# Patient Record
Sex: Female | Born: 1995 | Race: White | Hispanic: No | Marital: Single | Smoking: Never smoker
Health system: Southern US, Community
[De-identification: ages and names within clinical notes are randomized; demographics above are authoritative.]

## PROBLEM LIST (undated history)

## (undated) DIAGNOSIS — F32A Depression, unspecified: Secondary | ICD-10-CM

## (undated) DIAGNOSIS — T7840XA Allergy, unspecified, initial encounter: Secondary | ICD-10-CM

## (undated) DIAGNOSIS — F419 Anxiety disorder, unspecified: Secondary | ICD-10-CM

## (undated) HISTORY — DX: Allergy, unspecified, initial encounter: T78.40XA

## (undated) HISTORY — DX: Depression, unspecified: F32.A

---
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
## 2017-12-10 ENCOUNTER — Emergency Department
Admission: EM | Admit: 2017-12-10 | Discharge: 2017-12-10 | Disposition: A | Discharge status: Home / Self Care | Payer: Self-pay | Attending: Emergency Medicine | Admitting: Emergency Medicine

## 2017-12-10 ENCOUNTER — Other Ambulatory Visit: Payer: Self-pay

## 2017-12-10 DIAGNOSIS — R21 Rash and other nonspecific skin eruption: Secondary | ICD-10-CM | POA: Insufficient documentation

## 2017-12-10 MED ORDER — PENICILLIN G BENZATHINE 1200000 UNIT/2ML IM SUSP
2.4000 10*6.[IU] | Freq: Once | INTRAMUSCULAR | Status: AC
  Administered 2017-12-10: 2.4 10*6.[IU] via INTRAMUSCULAR
  Filled 2017-12-10 (×2): qty 4

## 2017-12-10 MED ORDER — DIPHENHYDRAMINE HCL 50 MG/ML IJ SOLN
50.0000 mg | Freq: Once | INTRAMUSCULAR | Status: AC
  Administered 2017-12-10: 50 mg via INTRAVENOUS
  Filled 2017-12-10: qty 1

## 2017-12-10 MED ORDER — METHYLPREDNISOLONE SODIUM SUCC 125 MG IJ SOLR
125.0000 mg | Freq: Once | INTRAMUSCULAR | Status: AC
  Administered 2017-12-10: 125 mg via INTRAVENOUS
  Filled 2017-12-10 (×3): qty 2

## 2017-12-10 MED ORDER — SODIUM CHLORIDE 0.9 % IV BOLUS
1000.0000 mL | Freq: Once | INTRAVENOUS | Status: AC
  Administered 2017-12-10: 1000 mL via INTRAVENOUS

## 2017-12-10 NOTE — ED Notes (Addendum)
Pt denies any pain. Pt states she noticed a rash Friday morning 9/13. Upon assessment this RN notices a rash on back, arms/legs  bilateral and abdomen. Pt states  taking OTC benadryl w/o relief. RR even and unlabored. NAD noted.

## 2017-12-10 NOTE — ED Triage Notes (Signed)
First Nurse Note:  C/O rash and throat feeling full since Friday.  Has been taking benadryl with no relief/  Benadryl last taken at around 1300 today.    AAOc3.  Skin warm and dry.  Flat red rash seen to trunk and neck.  Voice clear and strong.  Lungs CTA.  NAD

## 2017-12-10 NOTE — Discharge Instructions (Signed)
You have been seen in the emergency department for a rash.  As we discussed it is not entirely clear the cause of this rash at this time.  You have been treated with 2,400,000 units of penicillin as a precaution.  Please continue to use Benadryl 50 mg every 6 hours as needed for itching.  Return to the emergency department for any significant worsening of rash, development of fever, or any other symptom personally concerning to yourself.  Please follow-up with dermatology this week if the rash fails to improve.

## 2017-12-10 NOTE — ED Triage Notes (Signed)
Pt arrives to ED c/o of hives all over body and "throat feels like it's closing up." pt talking in complete sentences. States rash since Friday AM.   Started chlamydia drugs 2 weeks ago. Drank a beer with different ingredients in it today.   Alert, oriented, ambulatory.

## 2017-12-10 NOTE — ED Provider Notes (Signed)
[facility] Emergency Department Provider Note  Time seen: 5:30 PM  I have reviewed the triage vital signs and the nursing notes.   HISTORY  Chief Complaint Allergic Reaction    HPI Nancy Grant is a 22 y.o. female with no significant past medical history who presents to the emergency department for diffuse rash.  According to the patient she was recently treated for chlamydia after testing positive by her OB/GYN 2 weeks ago.  Took a one-time dose of antibiotics orally.  Also states at that time she was started on Depo-Provera injection.  States 2 days ago she developed diffuse hives all over her body.  States the rash started in her right upper extremity has spread to almost her whole body besides her face.  Right worse across the lower extremities were the lesions have coalesced to form large patches.  She describes them as somewhat itchy.  Denies any fever.  Denies any pain.  Denies any genital symptoms such as ulcerations recently.   History reviewed. No pertinent past medical history.  There are no active problems to display for this patient.   History reviewed. No pertinent surgical history.  Prior to Admission medications   Not on File    No Known Allergies  History reviewed. No pertinent family history.  Social History Social History   Tobacco Use  . Smoking status: Never Smoker  Substance Use Topics  . Alcohol use: Yes  . Drug use: Not on file    Review of Systems Constitutional: Negative for fever. Cardiovascular: Negative for chest pain. Respiratory: Negative for shortness of breath. Gastrointestinal: Negative for abdominal pain, vomiting Genitourinary: Negative for urinary compaints Musculoskeletal: Negative for musculoskeletal complaints Skin: Diffuse rash over most of the body.  Somewhat itchy. Neurological: Negative for headache All other ROS negative  ____________________________________________   PHYSICAL  EXAM:  VITAL SIGNS: ED Triage Vitals [12/10/17 1702]  Enc Vitals Group     BP 137/89     Pulse Rate 89     Resp 18     Temp 98.7 F (37.1 C)     Temp Source Oral     SpO2 100 %     Weight 140 lb (63.5 kg)     Height 5\' 9"  (1.753 m)     Head Circumference      Peak Flow      Pain Score 0     Pain Loc      Pain Edu?      Excl. in GC?    Constitutional: Alert and oriented. Well appearing and in no distress. Eyes: Normal exam ENT   Head: Normocephalic and atraumatic   Mouth/Throat: Mucous membranes are moist. Cardiovascular: Normal rate, regular rhythm. No murmur Respiratory: Normal respiratory effort without tachypnea nor retractions. Breath sounds are clear  Gastrointestinal: Soft and nontender. No distention.  Musculoskeletal: Nontender with normal range of motion in all extremities.  Neurologic:  Normal speech and language. No gross focal neurologic deficits Skin: Patient has diffuse rash over most of her body sparing her face.  Involves mostly the lower extremities but sparsely throughout the entire body more so on the back.  Does not involve the palms as well no lesions identified on the soles.  States it is somewhat itchy.  Across lower extremity many of the lesions have coalesced into larger patches.  Mildly erythematous. Psychiatric: Mood and affect are normal. Speech and behavior are normal.   ____________________________________________   INITIAL IMPRESSION / ASSESSMENT AND PLAN /  ED COURSE  Pertinent labs & imaging results that were available during my care of the patient were reviewed by me and considered in my medical decision making (see chart for details).  Patient presents to the emergency department with a diffuse rash.  Patient denies any known allergies.  Did take a Chlamydia treatment one-time dose 2 weeks ago as well as started on Depo-Provera 2 weeks ago.  Denies any new foods, denies any current medications, denies any new close detergents or other  new exposures known to the patient.  Took Benadryl at home and states the rash did not respond to the Benadryl.  As the patient did recently test positive for chlamydia she does have a diffuse maculopapular rash that does involve the palms I will send an RPR.  As a precaution we will treat with a one-time dose of IM penicillin 2,400,000 units.  We will place an IV and dose IV fluids Solu-Medrol and Benadryl to see if there is a response to histamine blockade.  Patient agreeable to plan of care.  Overall she appears very well, normal oropharynx, no signs of oral or pharyngeal edema, no wheeze no shortness of breath 100% room air saturation with a normal respiratory rate otherwise reassuring vitals.  Nearly no change in rash after 1 L of fluids IV Solu-Medrol and IV Benadryl.  Overall the patient appears very well, no distress.  She has been treated with 2,400,000 units of IM penicillin as a precaution.  RPR has been sent.  We will discharge with PCP follow-up.  Patient agreeable to plan of care.  ____________________________________________   FINAL CLINICAL IMPRESSION(S) / ED DIAGNOSES  Diffuse rash    Minna Antis, MD 12/10/17 1839

## 2017-12-12 ENCOUNTER — Other Ambulatory Visit: Payer: Self-pay

## 2017-12-12 ENCOUNTER — Encounter: Payer: Self-pay | Admitting: Intensive Care

## 2017-12-12 ENCOUNTER — Emergency Department
Admission: EM | Admit: 2017-12-12 | Discharge: 2017-12-12 | Disposition: A | Discharge status: Home / Self Care | Payer: Medicaid Other | Attending: Emergency Medicine | Admitting: Emergency Medicine

## 2017-12-12 DIAGNOSIS — R21 Rash and other nonspecific skin eruption: Secondary | ICD-10-CM | POA: Insufficient documentation

## 2017-12-12 HISTORY — DX: Anxiety disorder, unspecified: F41.9

## 2017-12-12 LAB — CBC WITH DIFFERENTIAL/PLATELET
BASOS ABS: 0 10*3/uL (ref 0–0.1)
BASOS PCT: 1 %
Eosinophils Absolute: 0.1 10*3/uL (ref 0–0.7)
Eosinophils Relative: 1 %
HEMATOCRIT: 41 % (ref 35.0–47.0)
Hemoglobin: 13.9 g/dL (ref 12.0–16.0)
LYMPHS PCT: 27 %
Lymphs Abs: 2 10*3/uL (ref 1.0–3.6)
MCH: 31.8 pg (ref 26.0–34.0)
MCHC: 34 g/dL (ref 32.0–36.0)
MCV: 93.6 fL (ref 80.0–100.0)
Monocytes Absolute: 0.6 10*3/uL (ref 0.2–0.9)
Monocytes Relative: 8 %
NEUTROS ABS: 4.8 10*3/uL (ref 1.4–6.5)
NEUTROS PCT: 63 %
Platelets: 216 10*3/uL (ref 150–440)
RBC: 4.38 MIL/uL (ref 3.80–5.20)
RDW: 12.5 % (ref 11.5–14.5)
WBC: 7.4 10*3/uL (ref 3.6–11.0)

## 2017-12-12 LAB — COMPREHENSIVE METABOLIC PANEL
ALBUMIN: 4 g/dL (ref 3.5–5.0)
ALT: 13 U/L (ref 0–44)
AST: 16 U/L (ref 15–41)
Alkaline Phosphatase: 50 U/L (ref 38–126)
Anion gap: 8 (ref 5–15)
BILIRUBIN TOTAL: 0.8 mg/dL (ref 0.3–1.2)
BUN: 12 mg/dL (ref 6–20)
CHLORIDE: 107 mmol/L (ref 98–111)
CO2: 24 mmol/L (ref 22–32)
CREATININE: 0.77 mg/dL (ref 0.44–1.00)
Calcium: 9 mg/dL (ref 8.9–10.3)
GFR calc Af Amer: >60 mL/min (ref >60)
GFR calc non Af Amer: >60 mL/min (ref >60)
GLUCOSE: 91 mg/dL (ref 70–99)
POTASSIUM: 3.9 mmol/L (ref 3.5–5.1)
Sodium: 139 mmol/L (ref 135–145)
Total Protein: 7.5 g/dL (ref 6.5–8.1)

## 2017-12-12 LAB — RPR: RPR: NONREACTIVE

## 2017-12-12 MED ORDER — HYDROXYZINE HCL 25 MG PO TABS: 25.0000 mg | ORAL_TABLET | Freq: Four times a day (QID) | ORAL | 0 refills | Status: DC | PRN

## 2017-12-12 MED ORDER — DIPHENHYDRAMINE HCL 50 MG/ML IJ SOLN
25.0000 mg | Freq: Once | INTRAMUSCULAR | Status: AC
  Administered 2017-12-12: 25 mg via INTRAVENOUS
  Filled 2017-12-12: qty 1

## 2017-12-12 MED ORDER — DEXAMETHASONE SODIUM PHOSPHATE 10 MG/ML IJ SOLN
10.0000 mg | Freq: Once | INTRAMUSCULAR | Status: AC
  Administered 2017-12-12: 10 mg via INTRAVENOUS

## 2017-12-12 MED ORDER — FAMOTIDINE IN NACL 20-0.9 MG/50ML-% IV SOLN
20.0000 mg | Freq: Once | INTRAVENOUS | Status: AC
  Administered 2017-12-12: 20 mg via INTRAVENOUS
  Filled 2017-12-12: qty 50

## 2017-12-12 MED ORDER — RANITIDINE HCL 150 MG PO TABS: 150.0000 mg | ORAL_TABLET | Freq: Two times a day (BID) | ORAL | 1 refills | Status: DC

## 2017-12-12 MED ORDER — DEXAMETHASONE SODIUM PHOSPHATE 10 MG/ML IJ SOLN
10.0000 mg | Freq: Once | INTRAMUSCULAR | Status: DC
  Filled 2017-12-12: qty 1

## 2017-12-12 MED ORDER — PREDNISONE 10 MG PO TABS: ORAL_TABLET | ORAL | 0 refills | Status: DC

## 2017-12-12 NOTE — ED Provider Notes (Signed)
[facility] Emergency Department Provider Note   ____________________________________________   First MD Initiated Contact with Patient 12/12/17 1118     (approximate)  I have reviewed the triage vital signs and the nursing notes.   HISTORY  Chief Complaint Rash   HPI Nancy Grant is a 22 y.o. female presents to the ED with complaint of rash.  Patient was seen in the ED on 12/12/2017 for the same rash.  She states that at that time she was given steroids and was improving until yesterday.  She still is unsure as to what caused this rash.  And looking through her records it appears that she was treated for chlamydia.  Patient states that rash is still itchy.  She is still denies any fever, chills, nausea, or difficulty breathing.  She denies any pain.  Records from her previous visit shows an RPR was also nonreactive.   Past Medical History:  Diagnosis Date  . Anxiety     There are no active problems to display for this patient.   History reviewed. No pertinent surgical history.  Prior to Admission medications   Medication Sig Start Date End Date Taking? Authorizing Provider  hydrOXYzine (ATARAX/VISTARIL) 25 MG tablet Take 1 tablet (25 mg total) by mouth every 6 (six) hours as needed for itching. 12/12/17   Tommi Rumps, PA-C  predniSONE (DELTASONE) 10 MG tablet Take 6 tablets  today, on day 2 take 5 tablets, day 3 take 4 tablets, day 4 take 3 tablets, day 5 take  2 tablets and 1 tablet the last day 12/12/17   Tommi Rumps, PA-C  ranitidine (ZANTAC) 150 MG tablet Take 1 tablet (150 mg total) by mouth 2 (two) times daily. 12/12/17 12/12/18  Tommi Rumps, PA-C    Allergies Patient has no known allergies.  History reviewed. No pertinent family history.  Social History Social History   Tobacco Use  . Smoking status: Never Smoker  . Smokeless tobacco: Never Used  Substance Use Topics  . Alcohol use: Yes    Comment: occ  . Drug  use: Never    Review of Systems Constitutional: No fever/chills ENT: No sore throat. Cardiovascular: Denies chest pain. Respiratory: Denies shortness of breath. Gastrointestinal:   No nausea, no vomiting.  Musculoskeletal: Negative for muscle aches. Skin: Positive for skin rash. Neurological: Negative for headaches, focal weakness or numbness. ___________________________________________   PHYSICAL EXAM:  VITAL SIGNS: ED Triage Vitals  Enc Vitals Group     BP 12/12/17 1251 109/73     Pulse Rate 12/12/17 1251 90     Resp 12/12/17 1251 16     Temp --      Temp src --      SpO2 12/12/17 1251 100 %     Weight --      Height --      Head Circumference --      Peak Flow --      Pain Score 12/12/17 1122 0     Pain Loc --      Pain Edu? --      Excl. in GC? --    Constitutional: Alert and oriented. Well appearing and in no acute distress. Eyes: Conjunctivae are normal.  Head: Atraumatic. Nose: No congestion/rhinnorhea. Mouth/Throat: Mucous membranes are moist.  Oropharynx non-erythematous.  No edema uvula is midline. Neck: No stridor.   Hematological/Lymphatic/Immunilogical: No cervical lymphadenopathy. Cardiovascular: Normal rate, regular rhythm. Grossly normal heart sounds.  Good peripheral circulation. Respiratory: Normal respiratory  effort.  No retractions. Lungs CTAB. Musculoskeletal: No lower extremity tenderness nor edema.  No joint effusions. Neurologic:  Normal speech and language. No gross focal neurologic deficits are appreciated.  Skin:  Skin is warm, dry and intact.  Diffuse erythematous rash scattered over the trunk and upper and lower extremities.  No rashes noted on the face.  There are single erythematous papules along with macular areas.  No vesicles are noted and no drainage from these areas.  Areas are nontender to palpation.  No warmth is appreciated. Psychiatric: Mood and affect are normal. Speech and behavior are  normal.  ____________________________________________   LABS (all labs ordered are listed, but only abnormal results are displayed)  Labs Reviewed  CBC WITH DIFFERENTIAL/PLATELET  COMPREHENSIVE METABOLIC PANEL    PROCEDURES  Procedure(s) performed: None  Procedures  Critical Care performed: No  ____________________________________________   INITIAL IMPRESSION / ASSESSMENT AND PLAN / ED COURSE  As part of my medical decision making, I reviewed the following data within the electronic MEDICAL RECORD NUMBER Notes from prior ED visits and [place] Controlled Substance Database  Patient presents to the ED with rash that reoccurred after being seen on 12/02/2017.  Patient stated that with the steroids that she received that day she was improving.  Patient was given Decadron 10 mg IV, Benadryl 25 mg IV and Pepcid 20 mg IV.  Patient improved and itching was reduced.  Patient was given a prescription for continued prednisone, Atarax as needed and Zantac 150 mg 1 twice daily for 30 days.  She is to follow-up with 1 of the dermatologist listed on her discharge papers.  Patient was improved prior to discharge.  ____________________________________________   FINAL CLINICAL IMPRESSION(S) / ED DIAGNOSES  Final diagnoses:  Rash and nonspecific skin eruption     ED Discharge Orders         Ordered    predniSONE (DELTASONE) 10 MG tablet     12/12/17 1235    ranitidine (ZANTAC) 150 MG tablet  2 times daily     12/12/17 1235    hydrOXYzine (ATARAX/VISTARIL) 25 MG tablet  Every 6 hours PRN     12/12/17 1235           Note:  This document was prepared using Dragon voice recognition software and may include unintentional dictation errors.    Tommi RumpsSummers, Rhonda L, PA-C 12/12/17 1615    Governor RooksLord, Rebecca, MD 12/14/17 [phone]

## 2017-12-12 NOTE — ED Triage Notes (Signed)
Patient was seen here Sunday night for rash all over body and was told to come back if rash got worse. Rash has continually gotten worse and now lips are swelling and throat feels scratchy. Denies problems eating/ swallowing.

## 2017-12-12 NOTE — Discharge Instructions (Addendum)
Follow-up with one the dermatologist listed on your discharge papers.  [facility] has several doctors in this building.  Dr. Adolphus Birchwoodasher also has someone working with him.  Called today as it may be next month before they can actually see you.  Begin taking prednisone as directed tapering down.  Try to take this medication same time every day.  Also Atarax if needed for itching.  Zantac is also an antihistamine and should be taken twice a day which will help with your rash.

## 2018-01-06 ENCOUNTER — Emergency Department
Admission: EM | Admit: 2018-01-06 | Discharge: 2018-01-06 | Disposition: A | Discharge status: Home / Self Care | Payer: No Typology Code available for payment source | Attending: Emergency Medicine | Admitting: Emergency Medicine

## 2018-01-06 ENCOUNTER — Emergency Department: Payer: No Typology Code available for payment source

## 2018-01-06 ENCOUNTER — Other Ambulatory Visit: Payer: Self-pay

## 2018-01-06 DIAGNOSIS — Y9389 Activity, other specified: Secondary | ICD-10-CM | POA: Diagnosis not present

## 2018-01-06 DIAGNOSIS — F419 Anxiety disorder, unspecified: Secondary | ICD-10-CM | POA: Insufficient documentation

## 2018-01-06 DIAGNOSIS — Y998 Other external cause status: Secondary | ICD-10-CM | POA: Diagnosis not present

## 2018-01-06 DIAGNOSIS — S80211A Abrasion, right knee, initial encounter: Secondary | ICD-10-CM | POA: Diagnosis not present

## 2018-01-06 DIAGNOSIS — Z79899 Other long term (current) drug therapy: Secondary | ICD-10-CM | POA: Diagnosis not present

## 2018-01-06 DIAGNOSIS — T07XXXA Unspecified multiple injuries, initial encounter: Secondary | ICD-10-CM

## 2018-01-06 DIAGNOSIS — S0990XA Unspecified injury of head, initial encounter: Secondary | ICD-10-CM | POA: Diagnosis not present

## 2018-01-06 DIAGNOSIS — S60511A Abrasion of right hand, initial encounter: Secondary | ICD-10-CM | POA: Diagnosis not present

## 2018-01-06 DIAGNOSIS — Y92410 Unspecified street and highway as the place of occurrence of the external cause: Secondary | ICD-10-CM | POA: Diagnosis not present

## 2018-01-06 DIAGNOSIS — S6991XA Unspecified injury of right wrist, hand and finger(s), initial encounter: Secondary | ICD-10-CM | POA: Diagnosis present

## 2018-01-06 LAB — CBC WITH DIFFERENTIAL/PLATELET
Abs Immature Granulocytes: 0.02 10*3/uL (ref 0.00–0.07)
BASOS PCT: 1 %
Basophils Absolute: 0 10*3/uL (ref 0.0–0.1)
EOS ABS: 0.1 10*3/uL (ref 0.0–0.5)
Eosinophils Relative: 2 %
HCT: 38.7 % (ref 36.0–46.0)
Hemoglobin: 13.5 g/dL (ref 12.0–15.0)
IMMATURE GRANULOCYTES: 0 %
LYMPHS ABS: 2.7 10*3/uL (ref 0.7–4.0)
Lymphocytes Relative: 43 %
MCH: 31.8 pg (ref 26.0–34.0)
MCHC: 34.9 g/dL (ref 30.0–36.0)
MCV: 91.1 fL (ref 80.0–100.0)
MONO ABS: 0.5 10*3/uL (ref 0.1–1.0)
MONOS PCT: 8 %
Neutro Abs: 2.9 10*3/uL (ref 1.7–7.7)
Neutrophils Relative %: 46 %
PLATELETS: 248 10*3/uL (ref 150–400)
RBC: 4.25 MIL/uL (ref 3.87–5.11)
RDW: 12.2 % (ref 11.5–15.5)
WBC: 6.3 10*3/uL (ref 4.0–10.5)
nRBC: 0 % (ref 0.0–0.2)

## 2018-01-06 LAB — COMPREHENSIVE METABOLIC PANEL
ALT: 17 U/L (ref 0–44)
AST: 21 U/L (ref 15–41)
Albumin: 4.3 g/dL (ref 3.5–5.0)
Alkaline Phosphatase: 57 U/L (ref 38–126)
Anion gap: 6 (ref 5–15)
BUN: 6 mg/dL (ref 6–20)
CHLORIDE: 112 mmol/L — AB (ref 98–111)
CO2: 24 mmol/L (ref 22–32)
Calcium: 8.5 mg/dL — ABNORMAL LOW (ref 8.9–10.3)
Creatinine, Ser: 0.61 mg/dL (ref 0.44–1.00)
GFR calc Af Amer: >60 mL/min (ref >60)
Glucose, Bld: 91 mg/dL (ref 70–99)
POTASSIUM: 3.3 mmol/L — AB (ref 3.5–5.1)
SODIUM: 142 mmol/L (ref 135–145)
Total Bilirubin: 0.4 mg/dL (ref 0.3–1.2)
Total Protein: 7.7 g/dL (ref 6.5–8.1)

## 2018-01-06 LAB — ETHANOL: Alcohol, Ethyl (B): 166 mg/dL — ABNORMAL HIGH (ref <10)

## 2018-01-06 MED ORDER — ONDANSETRON 4 MG PO TBDP: 4.0000 mg | ORAL_TABLET | Freq: Three times a day (TID) | ORAL | 0 refills | Status: DC | PRN

## 2018-01-06 MED ORDER — HYDROCODONE-ACETAMINOPHEN 5-325 MG PO TABS: 1.0000 | ORAL_TABLET | Freq: Four times a day (QID) | ORAL | 0 refills | Status: DC | PRN

## 2018-01-06 MED ORDER — SODIUM CHLORIDE 0.9 % IV BOLUS
1000.0000 mL | Freq: Once | INTRAVENOUS | Status: AC
  Administered 2018-01-06: 1000 mL via INTRAVENOUS

## 2018-01-06 NOTE — Discharge Instructions (Addendum)
1.  You may take medicines as needed for pain and nausea (Norco/Zofran #15). 2.  Return to the ER for worsening symptoms, vomiting, lethargy, difficulty breathing or other concerns.

## 2018-01-06 NOTE — ED Notes (Signed)
AAOx3.  Skin warm and dry.  NAD 
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
## 2021-09-06 ENCOUNTER — Ambulatory Visit: Payer: Medicaid Other | Admitting: Family Medicine

## 2022-08-11 ENCOUNTER — Ambulatory Visit (INDEPENDENT_AMBULATORY_CARE_PROVIDER_SITE_OTHER): Payer: No Payment, Other | Admitting: Clinical

## 2022-08-11 ENCOUNTER — Encounter (HOSPITAL_COMMUNITY): Payer: Self-pay

## 2022-08-11 DIAGNOSIS — F331 Major depressive disorder, recurrent, moderate: Secondary | ICD-10-CM | POA: Diagnosis not present
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
## 2023-04-21 ENCOUNTER — Ambulatory Visit (INDEPENDENT_AMBULATORY_CARE_PROVIDER_SITE_OTHER): Payer: BC Managed Care – PPO | Admitting: Nurse Practitioner

## 2023-04-21 ENCOUNTER — Encounter: Payer: Self-pay | Admitting: Nurse Practitioner

## 2023-04-21 VITALS — BP 110/68 | HR 89 | Temp 98.2°F | Ht 68.0 in | Wt 183.8 lb

## 2023-04-21 DIAGNOSIS — F331 Major depressive disorder, recurrent, moderate: Secondary | ICD-10-CM | POA: Insufficient documentation

## 2023-04-21 DIAGNOSIS — K625 Hemorrhage of anus and rectum: Secondary | ICD-10-CM | POA: Insufficient documentation

## 2023-04-21 DIAGNOSIS — K219 Gastro-esophageal reflux disease without esophagitis: Secondary | ICD-10-CM | POA: Diagnosis not present

## 2023-04-21 DIAGNOSIS — F411 Generalized anxiety disorder: Secondary | ICD-10-CM | POA: Diagnosis not present

## 2023-04-21 DIAGNOSIS — R195 Other fecal abnormalities: Secondary | ICD-10-CM | POA: Insufficient documentation

## 2023-04-21 MED ORDER — BUSPIRONE HCL 7.5 MG PO TABS: 7.5000 mg | ORAL_TABLET | Freq: Two times a day (BID) | ORAL | 2 refills | Status: DC

## 2023-04-21 NOTE — Progress Notes (Signed)
Bethanie Dicker, NP-C Phone: [phone]  Nancy Grant is a 28 y.o. female who presents today to establish care.   Discussed the use of AI scribe software for clinical note transcription with the patient, who gave verbal consent to proceed.  History of Present Illness   The patient, with a history of anxiety and depression, presents with concerns about recent changes in bowel habits and chronic intermittent rectal bleeding. They report a recent episode of gastroenteritis, which has since resolved, but has been followed by persistent loose stools for the past week. The patient also notes a chronic issue of intermittent rectal bleeding over the past nine months, which has become more frequent recently. The bleeding is described as visible in the stool and on toilet paper, but not associated with significant straining or pain.  The patient also reports chronic, intermittent abdominal discomfort described as a "gas bubble," which is relieved by passing gas. They deny any associated nausea or vomiting. The patient's diet is reported as balanced, with a preference for vegetables and chicken, and moderate intake of spicy foods and caffeine.  Regarding their mental health, the patient reports a history of anxiety and depression, managed intermittently with Lexapro, Wellbutrin, and Vistaril in the past. They describe their anxiety as situational, with periods of exacerbation and remission. The patient expresses interest in starting a daily medication for anxiety management, as they have noticed a pattern of their anxiety worsening to the point of inducing depressive symptoms. They have previously tried Lexapro and Wellbutrin, but discontinued due to undesirable side effects.      Social History   Tobacco Use  Smoking Status Never  Smokeless Tobacco Never    No current outpatient medications on file prior to visit.   No current facility-administered medications on file prior to visit.    ROS see  history of present illness  Objective  Physical Exam Vitals:   04/21/23 1400  BP: 110/68  Pulse: 89  Temp: 98.2 F (36.8 C)  SpO2: 95%    BP Readings from Last 3 Encounters:  04/21/23 110/68  01/06/18 120/82  12/12/17 109/73   Wt Readings from Last 3 Encounters:  04/21/23 183 lb 12.8 oz (83.4 kg)  01/06/18 145 lb (65.8 kg)  12/10/17 140 lb (63.5 kg)    Physical Exam Constitutional:      General: She is not in acute distress.    Appearance: Normal appearance.  HENT:     Head: Normocephalic.  Cardiovascular:     Rate and Rhythm: Normal rate and regular rhythm.     Heart sounds: Normal heart sounds.  Pulmonary:     Effort: Pulmonary effort is normal.     Breath sounds: Normal breath sounds.  Abdominal:     General: Abdomen is flat. Bowel sounds are normal.     Palpations: Abdomen is soft.     Tenderness: There is no abdominal tenderness.  Skin:    General: Skin is warm and dry.  Neurological:     General: No focal deficit present.     Mental Status: She is alert.  Psychiatric:        Mood and Affect: Mood normal.        Behavior: Behavior normal.    Assessment/Plan: Please see individual problem list.  Generalized anxiety disorder Assessment & Plan: There is a history of anxiety and depression with intermittent treatment. Anxiety is situational and can lead to depressive episodes. Previous trials of Lexapro and Wellbutrin were not well tolerated. PHQ- 2 and  GAD- 10 today. Start Buspar 7.5mg  twice daily for anxiety management. Plan for follow-up in two months to assess mood and effectiveness of Buspar. Counseled patient on common side effects. Encouraged to contact if worsening symptoms, unusual behavior changes or suicidal thoughts occur.   Orders: -     busPIRone HCl; Take 1 tablet (7.5 mg total) by mouth 2 (two) times daily.  Dispense: 60 tablet; Refill: 2  Rectal bleeding Assessment & Plan: Chronic intermittent rectal bleeding over the past year. There is  a family history of colitis. No significant abdominal pain, nausea, or vomiting is reported. Refer to Gastroenterology for further evaluation of chronic rectal bleeding and loose stools.  Orders: -     Ambulatory referral to Gastroenterology  Loose stools Assessment & Plan: There is a recent history of gastroenteritis with ongoing loose stools. There is a family history of colitis. No significant abdominal pain, nausea, or vomiting is reported. Refer to Gastroenterology for further evaluation of chronic rectal bleeding and loose stools. Advise following a low FODMAP diet.   Orders: -     Ambulatory referral to Gastroenterology  Gastroesophageal reflux disease, unspecified whether esophagitis present Assessment & Plan: Start over-the-counter famotidine daily for potential acid-related symptoms. Advise avoiding foods that may exacerbate symptoms, such as spicy, greasy, fatty, acidic foods, caffeine, and alcohol. Refer to GI.   Orders: -     Ambulatory referral to Gastroenterology    Return in about 2 months (around 06/19/2023) for Annual Exam.   Bethanie Dicker, NP-C [facility] - [facility]

## 2023-04-21 NOTE — Assessment & Plan Note (Signed)
There is a history of anxiety and depression with intermittent treatment. Anxiety is situational and can lead to depressive episodes. Previous trials of Lexapro and Wellbutrin were not well tolerated. PHQ- 2 and GAD- 10 today. Start Buspar 7.5mg  twice daily for anxiety management. Plan for follow-up in two months to assess mood and effectiveness of Buspar. Counseled patient on common side effects. Encouraged to contact if worsening symptoms, unusual behavior changes or suicidal thoughts occur.

## 2023-04-21 NOTE — Assessment & Plan Note (Addendum)
Chronic intermittent rectal bleeding over the past year. There is a family history of colitis. No significant abdominal pain, nausea, or vomiting is reported. Refer to Gastroenterology for further evaluation of chronic rectal bleeding and loose stools.

## 2023-04-21 NOTE — Assessment & Plan Note (Addendum)
There is a recent history of gastroenteritis with ongoing loose stools. There is a family history of colitis. No significant abdominal pain, nausea, or vomiting is reported. Refer to Gastroenterology for further evaluation of chronic rectal bleeding and loose stools. Advise following a low FODMAP diet.

## 2023-04-21 NOTE — Assessment & Plan Note (Addendum)
Start over-the-counter famotidine daily for potential acid-related symptoms. Advise avoiding foods that may exacerbate symptoms, such as spicy, greasy, fatty, acidic foods, caffeine, and alcohol. Refer to GI.

## 2023-05-03 ENCOUNTER — Other Ambulatory Visit: Payer: Self-pay | Admitting: Medical Genetics

## 2023-05-04 DIAGNOSIS — Z20822 Contact with and (suspected) exposure to covid-19: Secondary | ICD-10-CM | POA: Diagnosis not present

## 2023-05-04 DIAGNOSIS — J09X2 Influenza due to identified novel influenza A virus with other respiratory manifestations: Secondary | ICD-10-CM | POA: Diagnosis not present

## 2023-05-04 DIAGNOSIS — Z3202 Encounter for pregnancy test, result negative: Secondary | ICD-10-CM | POA: Diagnosis not present

## 2023-06-30 ENCOUNTER — Ambulatory Visit (INDEPENDENT_AMBULATORY_CARE_PROVIDER_SITE_OTHER): Payer: BC Managed Care – PPO | Admitting: Nurse Practitioner

## 2023-06-30 ENCOUNTER — Encounter: Payer: Self-pay | Admitting: Nurse Practitioner

## 2023-06-30 ENCOUNTER — Other Ambulatory Visit

## 2023-06-30 VITALS — BP 110/70 | HR 75 | Temp 98.2°F | Ht 68.0 in | Wt 178.6 lb

## 2023-06-30 DIAGNOSIS — Z13220 Encounter for screening for lipoid disorders: Secondary | ICD-10-CM

## 2023-06-30 DIAGNOSIS — F411 Generalized anxiety disorder: Secondary | ICD-10-CM

## 2023-06-30 DIAGNOSIS — Z23 Encounter for immunization: Secondary | ICD-10-CM | POA: Diagnosis not present

## 2023-06-30 DIAGNOSIS — Z1159 Encounter for screening for other viral diseases: Secondary | ICD-10-CM

## 2023-06-30 DIAGNOSIS — E663 Overweight: Secondary | ICD-10-CM | POA: Diagnosis not present

## 2023-06-30 DIAGNOSIS — Z0000 Encounter for general adult medical examination without abnormal findings: Secondary | ICD-10-CM | POA: Insufficient documentation

## 2023-06-30 DIAGNOSIS — Z803 Family history of malignant neoplasm of breast: Secondary | ICD-10-CM

## 2023-06-30 DIAGNOSIS — Z1329 Encounter for screening for other suspected endocrine disorder: Secondary | ICD-10-CM | POA: Diagnosis not present

## 2023-06-30 DIAGNOSIS — Z114 Encounter for screening for human immunodeficiency virus [HIV]: Secondary | ICD-10-CM

## 2023-06-30 LAB — LIPID PANEL
Cholesterol: 176 mg/dL (ref 0–200)
HDL: 38 mg/dL — ABNORMAL LOW (ref >39.00)
LDL Cholesterol: 121 mg/dL — ABNORMAL HIGH (ref 0–99)
NonHDL: 138.22
Total CHOL/HDL Ratio: 5
Triglycerides: 88 mg/dL (ref 0.0–149.0)
VLDL: 17.6 mg/dL (ref 0.0–40.0)

## 2023-06-30 LAB — COMPREHENSIVE METABOLIC PANEL WITH GFR
ALT: 23 U/L (ref 0–35)
AST: 19 U/L (ref 0–37)
Albumin: 4.6 g/dL (ref 3.5–5.2)
Alkaline Phosphatase: 54 U/L (ref 39–117)
BUN: 8 mg/dL (ref 6–23)
CO2: 26 meq/L (ref 19–32)
Calcium: 9.6 mg/dL (ref 8.4–10.5)
Chloride: 103 meq/L (ref 96–112)
Creatinine, Ser: 0.76 mg/dL (ref 0.40–1.20)
GFR: 107.22 mL/min (ref >60.00)
Glucose, Bld: 96 mg/dL (ref 70–99)
Potassium: 4.2 meq/L (ref 3.5–5.1)
Sodium: 139 meq/L (ref 135–145)
Total Bilirubin: 0.5 mg/dL (ref 0.2–1.2)
Total Protein: 8.1 g/dL (ref 6.0–8.3)

## 2023-06-30 LAB — CBC WITH DIFFERENTIAL/PLATELET
Basophils Absolute: 0.1 10*3/uL (ref 0.0–0.1)
Basophils Relative: 1 % (ref 0.0–3.0)
Eosinophils Absolute: 0.6 10*3/uL (ref 0.0–0.7)
Eosinophils Relative: 8.4 % — ABNORMAL HIGH (ref 0.0–5.0)
HCT: 41.7 % (ref 36.0–46.0)
Hemoglobin: 14 g/dL (ref 12.0–15.0)
Lymphocytes Relative: 34.5 % (ref 12.0–46.0)
Lymphs Abs: 2.4 10*3/uL (ref 0.7–4.0)
MCHC: 33.6 g/dL (ref 30.0–36.0)
MCV: 91.9 fl (ref 78.0–100.0)
Monocytes Absolute: 0.4 10*3/uL (ref 0.1–1.0)
Monocytes Relative: 5.7 % (ref 3.0–12.0)
Neutro Abs: 3.5 10*3/uL (ref 1.4–7.7)
Neutrophils Relative %: 50.4 % (ref 43.0–77.0)
Platelets: 239 10*3/uL (ref 150.0–400.0)
RBC: 4.54 Mil/uL (ref 3.87–5.11)
RDW: 13.2 % (ref 11.5–15.5)
WBC: 7 10*3/uL (ref 4.0–10.5)

## 2023-06-30 LAB — VITAMIN D 25 HYDROXY (VIT D DEFICIENCY, FRACTURES): VITD: 13.37 ng/mL — ABNORMAL LOW (ref 30.00–100.00)

## 2023-06-30 LAB — TSH: TSH: 1.67 u[IU]/mL (ref 0.35–5.50)

## 2023-06-30 LAB — HEMOGLOBIN A1C: Hgb A1c MFr Bld: 5.3 % (ref 4.6–6.5)

## 2023-06-30 NOTE — Progress Notes (Signed)
 Bethanie Dicker, NP-C Phone: [phone]  Dyana Magner Croak is a 28 y.o. female who presents today for annual exam.   Discussed the use of AI scribe software for clinical note transcription with the patient, who gave verbal consent to proceed.  History of Present Illness   Xian Apostol Poitra is a 28 year old female who presents for a physical exam and follow-up on Buspar use.  She takes Buspar twice a day for anxiety, which she describes as manageable and not affecting her daily life. She occasionally experiences significant fatigue after taking Buspar, though this has not been a recent issue. No chest pain, shortness of breath, abdominal pain, urinary symptoms, abnormal discharge, headaches, dizziness, trouble swallowing, skin changes, joint pains, or swelling in her legs. Her mood and anxiety levels are stable, but she sometimes struggles with sleep, particularly staying asleep, and manages this with melatonin.  She has not received feedback from a GI referral made a couple of weeks ago. She is due for a pap smear, as her last one was in September 2019, and plans to return for this after her menstrual period, which she describes as light and regular, occurring every 30 days. She experiences increased stress levels a week before her period, which normalizes afterward. She is not on birth control.  She has had two COVID vaccines and is unsure about her last tetanus shot, but records indicate it was in 2008. She believes she was up to date as of 2017. She has not had recent blood work and is considering it. She tested positive for an unspecified STD in the past, treated with a single-dose medication that caused a severe allergic reaction, leading to emergency room visits.  Her family history is significant for breast cancer; her mother was diagnosed at age 24 and her great-grandmother also had breast cancer. Her mother's cancer was caught early and treated surgically, and she is on hormone blockers due  to estrogen-related cancer. There is also a mention of a great-aunt who died of 'female cancer.'  She leads a sedentary lifestyle but tries to eat healthily, focusing on vegetables and moderate intake of carbs and meats. She does not smoke, drink alcohol, or use drugs. She is not sexually active and does not currently see a dentist or eye doctor.      Social History   Tobacco Use  Smoking Status Never  Smokeless Tobacco Never    Current Outpatient Medications on File Prior to Visit  Medication Sig Dispense Refill   busPIRone (BUSPAR) 7.5 MG tablet Take 1 tablet (7.5 mg total) by mouth 2 (two) times daily. 60 tablet 2   No current facility-administered medications on file prior to visit.     ROS see history of present illness  Objective  Physical Exam Vitals:   06/30/23 1307  BP: 110/70  Pulse: 75  Temp: 98.2 F (36.8 C)  SpO2: 97%    BP Readings from Last 3 Encounters:  06/30/23 110/70  04/21/23 110/68  01/06/18 120/82   Wt Readings from Last 3 Encounters:  06/30/23 178 lb 9.6 oz (81 kg)  04/21/23 183 lb 12.8 oz (83.4 kg)  01/06/18 145 lb (65.8 kg)    Physical Exam Constitutional:      General: She is not in acute distress.    Appearance: Normal appearance.  HENT:     Head: Normocephalic.     Right Ear: Tympanic membrane normal.     Left Ear: Tympanic membrane normal.     Nose: Nose normal.  Mouth/Throat:     Mouth: Mucous membranes are moist.     Pharynx: Oropharynx is clear.  Eyes:     Conjunctiva/sclera: Conjunctivae normal.     Pupils: Pupils are equal, round, and reactive to light.  Neck:     Thyroid: No thyromegaly.  Cardiovascular:     Rate and Rhythm: Normal rate and regular rhythm.     Heart sounds: Normal heart sounds.  Pulmonary:     Effort: Pulmonary effort is normal.     Breath sounds: Normal breath sounds.  Abdominal:     General: Abdomen is flat. Bowel sounds are normal.     Palpations: Abdomen is soft. There is no mass.      Tenderness: There is no abdominal tenderness.  Musculoskeletal:        General: Normal range of motion.  Lymphadenopathy:     Cervical: No cervical adenopathy.  Skin:    General: Skin is warm and dry.     Findings: No rash.  Neurological:     General: No focal deficit present.     Mental Status: She is alert.  Psychiatric:        Mood and Affect: Mood normal.        Behavior: Behavior normal.     Assessment/Plan: Please see individual problem list.  Preventative health care Assessment & Plan: Physical exam complete. We will check lab work as outlined and contact patient with results. A Pap smear is due since the last one was in September 2019. She prefers to return for the procedure. Schedule a Pap smear appointment. The last tetanus shot was in 2008, and a booster is due. No recent injuries reported. Administer a tetanus booster shot today. Declines flu and additional COVID vaccines. She is generally healthy but leads a sedentary lifestyle. She maintains a balanced diet and does not smoke, drink alcohol, or use drugs. Encourage increased physical activity. Advise on finding a dentist and eye doctor. Return for pap, then in one year, sooner as needed.   Orders: -     CBC with Differential/Platelet -     Comprehensive metabolic panel with GFR  Generalized anxiety disorder Assessment & Plan: Anxiety is well-managed with Buspar 7.5 mg twice daily. Continue the current dosage of Buspar. PHQ-4 and GAD- 5 today. Monitor anxiety symptoms and adjust medication if necessary.  Orders: -     VITAMIN D 25 Hydroxy (Vit-D Deficiency, Fractures)  Family history of breast cancer in mother Assessment & Plan: There is a significant family history with her mother and great-grandmother diagnosed. Recommend starting mammogram screenings at age 21. Educate on self-breast exams and advise reporting any abnormalities.   Overweight (BMI 25.0-29.9) -     Hemoglobin A1c  Thyroid disorder screen -      TSH  Lipid screening -     Lipid panel  Encounter for screening for HIV -     HIV Antibody (routine testing w rflx)  Encounter for hepatitis C screening test for low risk patient -     Hepatitis C antibody  Need for Tdap vaccination -     Tdap vaccine greater than or equal to 7yo IM    Return for Pap smear, then in one year, sooner as needed.   Bethanie Dicker, NP-C [facility] - [facility]

## 2023-07-01 LAB — HIV ANTIBODY (ROUTINE TESTING W REFLEX): HIV 1&2 Ab, 4th Generation: NONREACTIVE

## 2023-07-01 LAB — HEPATITIS C ANTIBODY: Hepatitis C Ab: NONREACTIVE

## 2023-07-04 ENCOUNTER — Other Ambulatory Visit: Payer: Self-pay | Admitting: Nurse Practitioner

## 2023-07-04 ENCOUNTER — Encounter: Payer: Self-pay | Admitting: Nurse Practitioner

## 2023-07-04 DIAGNOSIS — E559 Vitamin D deficiency, unspecified: Secondary | ICD-10-CM

## 2023-07-04 MED ORDER — VITAMIN D (ERGOCALCIFEROL) 1.25 MG (50000 UNIT) PO CAPS: 50000.0000 [IU] | ORAL_CAPSULE | ORAL | 1 refills | Status: AC

## 2023-07-06 ENCOUNTER — Encounter: Payer: Self-pay | Admitting: Nurse Practitioner

## 2023-07-06 NOTE — Assessment & Plan Note (Signed)
 Physical exam complete. We will check lab work as outlined and contact patient with results. A Pap smear is due since the last one was in September 2019. She prefers to return for the procedure. Schedule a Pap smear appointment. The last tetanus shot was in 2008, and a booster is due. No recent injuries reported. Administer a tetanus booster shot today. Declines flu and additional COVID vaccines. She is generally healthy but leads a sedentary lifestyle. She maintains a balanced diet and does not smoke, drink alcohol, or use drugs. Encourage increased physical activity. Advise on finding a dentist and eye doctor. Return for pap, then in one year, sooner as needed.

## 2023-07-06 NOTE — Assessment & Plan Note (Signed)
 There is a significant family history with her mother and great-grandmother diagnosed. Recommend starting mammogram screenings at age 28. Educate on self-breast exams and advise reporting any abnormalities.

## 2023-07-06 NOTE — Assessment & Plan Note (Signed)
 Anxiety is well-managed with Buspar 7.5 mg twice daily. Continue the current dosage of Buspar. PHQ-4 and GAD- 5 today. Monitor anxiety symptoms and adjust medication if necessary.

## 2023-07-20 ENCOUNTER — Other Ambulatory Visit: Payer: Self-pay | Admitting: Nurse Practitioner

## 2023-07-20 DIAGNOSIS — F411 Generalized anxiety disorder: Secondary | ICD-10-CM

## 2023-08-25 ENCOUNTER — Ambulatory Visit: Admitting: Nurse Practitioner

## 2023-09-14 ENCOUNTER — Other Ambulatory Visit: Payer: Self-pay | Admitting: Nurse Practitioner

## 2023-09-14 DIAGNOSIS — R195 Other fecal abnormalities: Secondary | ICD-10-CM

## 2023-09-14 DIAGNOSIS — K625 Hemorrhage of anus and rectum: Secondary | ICD-10-CM

## 2023-09-14 DIAGNOSIS — K2100 Gastro-esophageal reflux disease with esophagitis, without bleeding: Secondary | ICD-10-CM

## 2023-09-17 ENCOUNTER — Other Ambulatory Visit: Payer: Self-pay | Admitting: Nurse Practitioner

## 2023-09-17 DIAGNOSIS — F411 Generalized anxiety disorder: Secondary | ICD-10-CM

## 2023-11-15 ENCOUNTER — Other Ambulatory Visit: Payer: Self-pay | Admitting: Nurse Practitioner

## 2023-11-15 DIAGNOSIS — F411 Generalized anxiety disorder: Secondary | ICD-10-CM

## 2023-12-04 ENCOUNTER — Encounter: Payer: Self-pay | Admitting: Nurse Practitioner

## 2024-01-05 ENCOUNTER — Other Ambulatory Visit: Payer: Self-pay | Admitting: Medical Genetics

## 2024-01-05 DIAGNOSIS — Z006 Encounter for examination for normal comparison and control in clinical research program: Secondary | ICD-10-CM

## 2024-03-03 ENCOUNTER — Other Ambulatory Visit: Payer: Self-pay | Admitting: Nurse Practitioner

## 2024-03-03 DIAGNOSIS — E559 Vitamin D deficiency, unspecified: Secondary | ICD-10-CM

## 2024-03-06 ENCOUNTER — Other Ambulatory Visit: Payer: Self-pay

## 2024-03-06 DIAGNOSIS — F411 Generalized anxiety disorder: Secondary | ICD-10-CM

## 2024-07-05 ENCOUNTER — Encounter: Admitting: Nurse Practitioner
